# Patient Record
Sex: Female | Born: 1995 | Race: White | Hispanic: No | Smoking: Current every day smoker
Health system: Southern US, Community
[De-identification: ages and names within clinical notes are randomized; demographics above are authoritative.]

## PROBLEM LIST (undated history)

## (undated) DIAGNOSIS — T8859XA Other complications of anesthesia, initial encounter: Secondary | ICD-10-CM

## (undated) DIAGNOSIS — Z9889 Other specified postprocedural states: Secondary | ICD-10-CM

## (undated) DIAGNOSIS — T4145XA Adverse effect of unspecified anesthetic, initial encounter: Secondary | ICD-10-CM

## (undated) DIAGNOSIS — G43909 Migraine, unspecified, not intractable, without status migrainosus: Secondary | ICD-10-CM

## (undated) DIAGNOSIS — R112 Nausea with vomiting, unspecified: Secondary | ICD-10-CM

## (undated) DIAGNOSIS — H539 Unspecified visual disturbance: Secondary | ICD-10-CM

## (undated) HISTORY — PX: SKIN GRAFT: SHX250

## (undated) HISTORY — DX: Migraine, unspecified, not intractable, without status migrainosus: G43.909

---
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
## 2016-02-01 ENCOUNTER — Encounter: Payer: Self-pay | Admitting: Family Medicine

## 2016-02-01 ENCOUNTER — Ambulatory Visit (INDEPENDENT_AMBULATORY_CARE_PROVIDER_SITE_OTHER): Payer: Medicaid Other | Admitting: Family Medicine

## 2016-02-01 ENCOUNTER — Other Ambulatory Visit: Payer: Medicaid Other

## 2016-02-01 VITALS — BP 105/69 | HR 89 | Temp 99.2°F | Ht 65.0 in | Wt 140.6 lb

## 2016-02-01 DIAGNOSIS — Z309 Encounter for contraceptive management, unspecified: Secondary | ICD-10-CM

## 2016-02-01 DIAGNOSIS — R21 Rash and other nonspecific skin eruption: Secondary | ICD-10-CM

## 2016-02-01 DIAGNOSIS — R8290 Unspecified abnormal findings in urine: Secondary | ICD-10-CM | POA: Diagnosis not present

## 2016-02-01 DIAGNOSIS — Z3009 Encounter for other general counseling and advice on contraception: Secondary | ICD-10-CM

## 2016-02-01 LAB — URINALYSIS, COMPLETE
Bilirubin, UA: NEGATIVE
GLUCOSE, UA: NEGATIVE
Ketones, UA: NEGATIVE
Leukocytes, UA: NEGATIVE
NITRITE UA: NEGATIVE
PH UA: 5.5 (ref 5.0–7.5)
Protein, UA: NEGATIVE
RBC, UA: NEGATIVE
Specific Gravity, UA: 1.01 (ref 1.005–1.030)
UUROB: 0.2 mg/dL (ref 0.2–1.0)

## 2016-02-01 LAB — MICROSCOPIC EXAMINATION: RBC, UA: NONE SEEN /hpf (ref 0–?)

## 2016-02-01 LAB — PREGNANCY, URINE: PREG TEST UR: NEGATIVE

## 2016-02-01 NOTE — Progress Notes (Signed)
BP 105/69   Pulse 89   Temp 99.2 F (37.3 C) (Oral)   Ht '5\' 5"'$  (1.651 m)   Wt 140 lb 9.6 oz (63.8 kg)   BMI 23.40 kg/m    Subjective:    Patient ID: Elizabeth Clayton, female    DOB: Jun 22, 1995, 20 y.o.   MRN: [id number]  HPI: Elizabeth Clayton is a 20 y.o. female presenting on 02/01/2016 for Labwork needed (for [facility], Dr. Marcelino Scot, needs UA, CRP & ESR) and Discuss Nexplanon removal (has appointment on 9/15 for removal, would like to have new one put back in)   HPI Rash Patient has had recurrently over her life this rash that occurs which is a pinkish discoloration of her skin and she can write on it and I will leave white marks for a few minutes before it changes back to pink. It looks as though she splashed but it occurs over her whole body. She does not have it currently today. She says it is not pruritic or painful and she has never noted what brings it on. She denies any joint issues or breathing issues or renal issues that she knows of. She comes with recommendations from her orthopedic to have her tested for possible rheumatic disease or infections and to do a CRP and sedimentation rate and urinalysis.  Birth control counseling Patient is coming in today for birth control counseling as she wants to have her Nexplanon reinserted and the old one removed. She has an appointment to come in next week. Her last menstrual period started August 12 and ended August 29. She was due for her 3 year mark at the beginning of August and is almost a month overdue for removal and reinsertion. She has been using condoms in the meantime since she has let it lapse. She denies any dysuria or vaginal bleeding or vaginal discharge currently. She likes the Nexplanon would like to go back on it.  Relevant past medical, surgical, family and social history reviewed and updated as indicated. Interim medical history since our last visit reviewed. Allergies and medications reviewed and  updated.  Review of Systems  Constitutional: Negative for chills and fever.  HENT: Negative for congestion, ear discharge and ear pain.   Eyes: Negative for redness and visual disturbance.  Respiratory: Negative for chest tightness and shortness of breath.   Cardiovascular: Negative for chest pain and leg swelling.  Genitourinary: Negative for difficulty urinating, dysuria, flank pain, frequency, pelvic pain, vaginal bleeding, vaginal discharge and vaginal pain.  Musculoskeletal: Negative for arthralgias, back pain, gait problem, joint swelling and myalgias.  Skin: Positive for color change and rash.  Neurological: Negative for light-headedness and headaches.  Psychiatric/Behavioral: Negative for agitation and behavioral problems.  All other systems reviewed and are negative.   Per HPI unless specifically indicated above     Medication List       Accurate as of 02/01/16 12:53 PM. Always use your most recent med list.          NEXPLANON 68 MG Impl implant Generic drug:  etonogestrel Inject 1 each into the skin once. Implanted October 2014          Objective:    BP 105/69   Pulse 89   Temp 99.2 F (37.3 C) (Oral)   Ht '5\' 5"'$  (1.651 m)   Wt 140 lb 9.6 oz (63.8 kg)   BMI 23.40 kg/m   Wt Readings from Last 3 Encounters:  02/01/16 140 lb 9.6 oz (63.8 kg) (  71 %, Z= 0.54)*  07/07/15 126 lb 3.2 oz (57.2 kg) (50 %, Z= 0.00)*  04/13/15 133 lb 9.6 oz (60.6 kg) (64 %, Z= 0.36)*   * Growth percentiles are based on CDC 2-20 Years data.    Physical Exam  Constitutional: She is oriented to person, place, and time. She appears well-developed and well-nourished. No distress.  Eyes: Conjunctivae and EOM are normal. Pupils are equal, round, and reactive to light.  Neck: Neck supple. No thyromegaly present.  Cardiovascular: Normal rate, regular rhythm, normal heart sounds and intact distal pulses.   No murmur heard. Pulmonary/Chest: Effort normal and breath sounds normal. No  respiratory distress. She has no wheezes.  Abdominal: Soft. Bowel sounds are normal.  Musculoskeletal: Normal range of motion. She exhibits no edema or tenderness.  Lymphadenopathy:    She has no cervical adenopathy.  Neurological: She is alert and oriented to person, place, and time. Coordination normal.  Skin: Skin is warm and dry. No rash noted. She is not diaphoretic. No erythema. No pallor.  Psychiatric: She has a normal mood and affect. Her behavior is normal.  Nursing note and vitals reviewed.    Urinalysis: 6-10 daily BCs, greater than 10 epithelial cells, 0-10 renal epithelial cells, few bacteria  Urine pregnancy: Negative    Assessment & Plan:   Problem List Items Addressed This Visit    None    Visit Diagnoses    Rash and nonspecific skin eruption    -  Primary   Possible concern for dermatomyositis versus other rheumatological disorder, we'll do labs   Relevant Orders   Urinalysis, Complete   Sedimentation rate   ANA Comprehensive Panel   High sensitivity CRP   Birth control counseling       Patient is one month overdue for reinsertion of  Nexplanon will have come back in 1 week but will do urinary pregnancy test   Relevant Orders   Pregnancy, urine       Follow up plan: Return in about 1 week (around 02/08/2016), or if symptoms worsen or fail to improve, for Patient are to has appointment for Nexplanon .  Counseling provided for all of the vaccine components Orders Placed This Encounter  Procedures  . Urinalysis, Complete  . Pregnancy, urine  . Sedimentation rate  . ANA Comprehensive Panel  . High sensitivity CRP    Caryl Pina, MD [facility] 02/01/2016, 12:53 PM

## 2016-02-02 LAB — ANA COMPREHENSIVE PANEL
Chromatin Ab SerPl-aCnc: <0.2 AI (ref 0.0–0.9)
ENA RNP AB: 0.2 AI (ref 0.0–0.9)
ENA SM Ab Ser-aCnc: <0.2 AI (ref 0.0–0.9)
ENA SSA (RO) Ab: <0.2 AI (ref 0.0–0.9)
Scleroderma SCL-70: <0.2 AI (ref 0.0–0.9)
dsDNA Ab: 1 IU/mL (ref 0–9)

## 2016-02-02 LAB — SEDIMENTATION RATE: SED RATE: 2 mm/h (ref 0–32)

## 2016-02-02 LAB — HIGH SENSITIVITY CRP: CRP HIGH SENSITIVITY: 0.96 mg/L (ref 0.00–3.00)

## 2016-02-03 ENCOUNTER — Other Ambulatory Visit: Payer: Self-pay | Admitting: Family Medicine

## 2016-02-03 LAB — URINE CULTURE

## 2016-02-03 MED ORDER — SULFAMETHOXAZOLE-TRIMETHOPRIM 800-160 MG PO TABS: 1.0000 | ORAL_TABLET | Freq: Two times a day (BID) | ORAL | 0 refills | Status: DC

## 2016-02-10 ENCOUNTER — Encounter: Payer: Self-pay | Admitting: Family Medicine

## 2016-02-10 ENCOUNTER — Ambulatory Visit (INDEPENDENT_AMBULATORY_CARE_PROVIDER_SITE_OTHER): Payer: Medicaid Other | Admitting: Family Medicine

## 2016-02-10 VITALS — BP 115/74 | HR 85 | Temp 98.7°F | Ht 65.0 in | Wt 141.2 lb

## 2016-02-10 DIAGNOSIS — Z30017 Encounter for initial prescription of implantable subdermal contraceptive: Secondary | ICD-10-CM

## 2016-02-10 DIAGNOSIS — Z3046 Encounter for surveillance of implantable subdermal contraceptive: Secondary | ICD-10-CM | POA: Diagnosis not present

## 2016-02-10 LAB — PREGNANCY, URINE: PREG TEST UR: NEGATIVE

## 2016-02-10 NOTE — Progress Notes (Signed)
Nexplanon removal and reinsertion  Patient is one month overdue on her previous device and we did a urine pregnancy test and it is negative today.  Nexplanon removal and reinsertion: Patient educated on Nexplanon birth control and its side effects and the side effects from the procedure. Patient wishes to continue. Left inner arm prepped with Betadine. 3 mL of 2% lidocaine without epinephrine used for anesthesia. A 15 blade was used to make incision and forceps used to remove the previous device. Device was prepped using sterile procedure. Nexplanon was reinserted using package and Company directions. Steri-Strips were applied. Bleeding was minimal and patient tolerated procedure well.  Problem List Items Addressed This Visit    None    Visit Diagnoses    Nexplanon insertion    -  Primary   Relevant Orders   Pregnancy, urine   Nexplanon removal

## 2016-02-27 IMAGING — CT CT PELVIS W/O CM
3 of 4 series · 17 of 46 positions shown, 19 images · non-contrast
Comparison: 07/01/2013

CLINICAL DATA: Sacral and coccygeal pain for 3 months. History of
prior trauma and surgical fixation of the pubic symphysis and SI
joints.

EXAM:
CT PELVIS WITHOUT CONTRAST
TECHNIQUE: Multidetector CT imaging of the pelvis was performed following the
standard protocol without intravenous contrast.

[Series 2: soft tissue pelvis without (person_name) · axial · non-contrast · 0.69mm/px · z∈[-251,-36]mm · 10 of 53 slices shown, 12 images]
[im 5/53  soft-tissue]
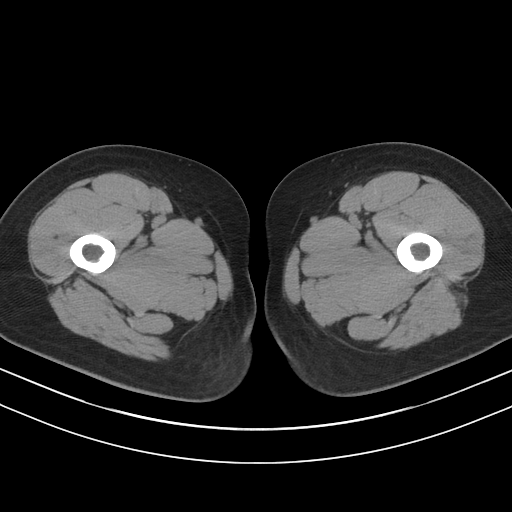
[im 5/53  bone]
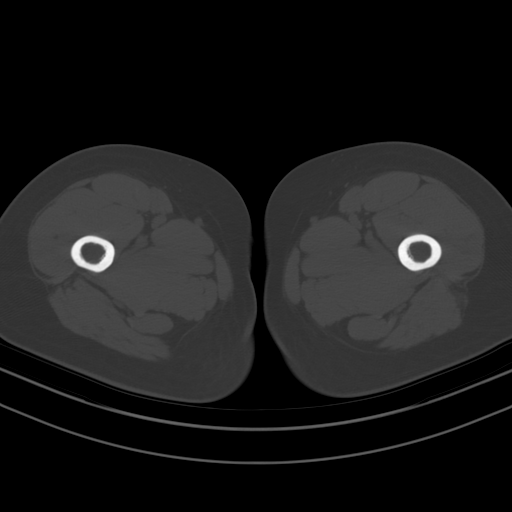
[im 10/53  soft-tissue]
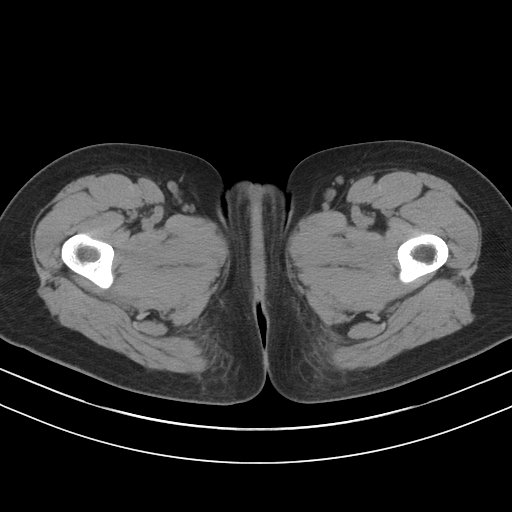
[im 15/53  soft-tissue]
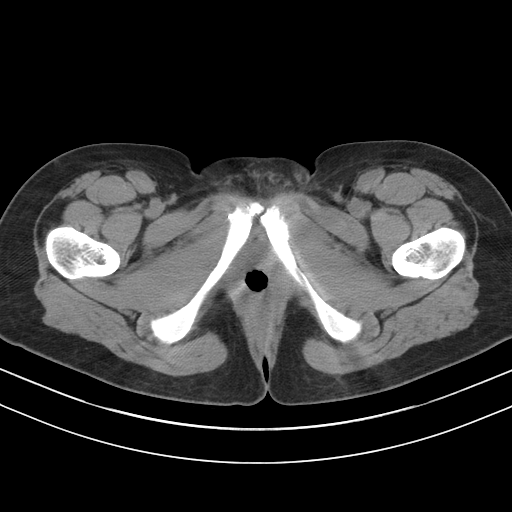
[im 19/53  soft-tissue]
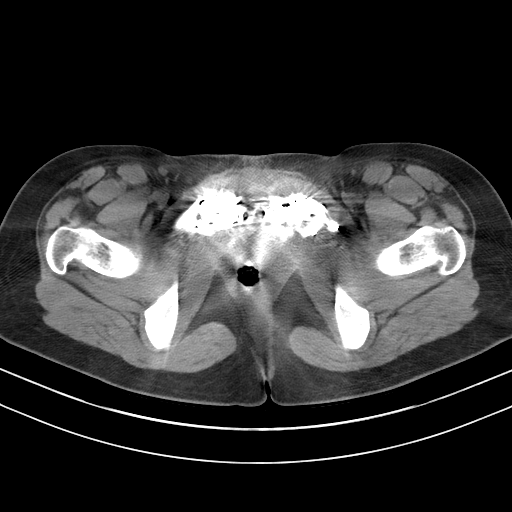
[im 24/53  soft-tissue]
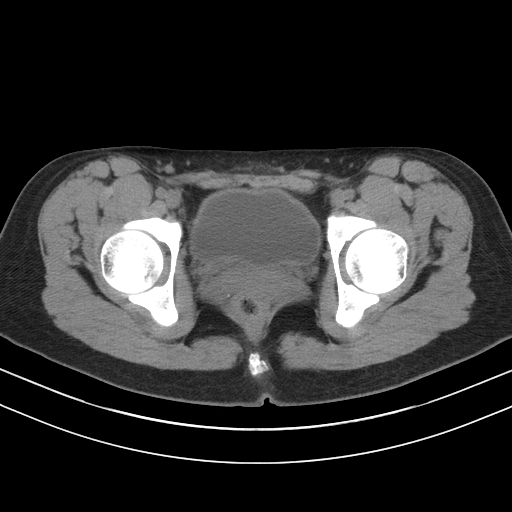
[im 29/53  soft-tissue]
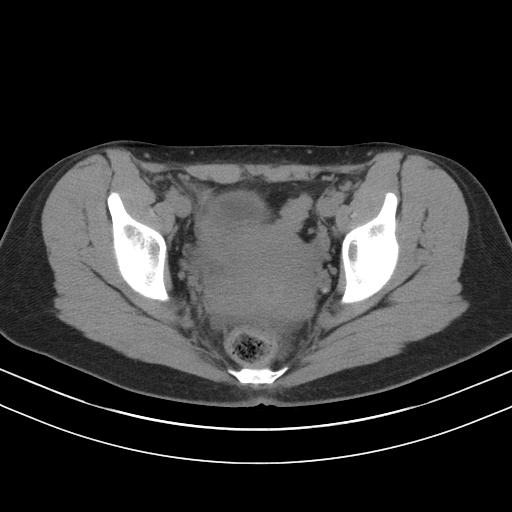
[im 34/53  soft-tissue]
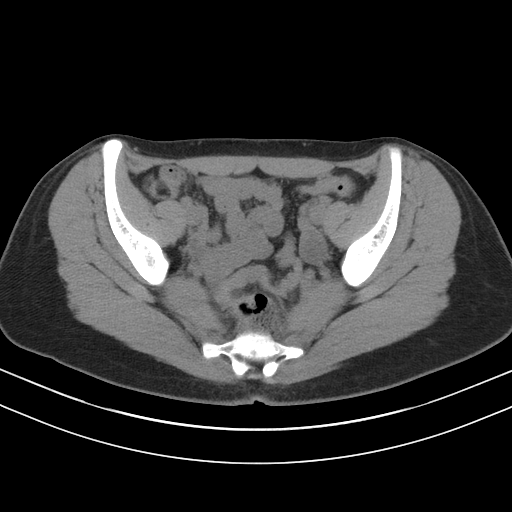
[im 38/53  soft-tissue]
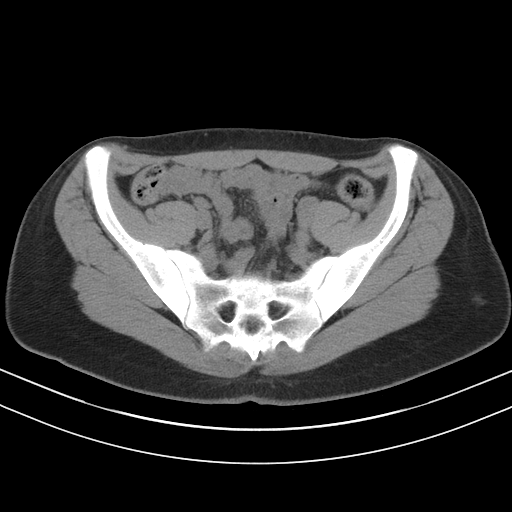
[im 43/53  soft-tissue]
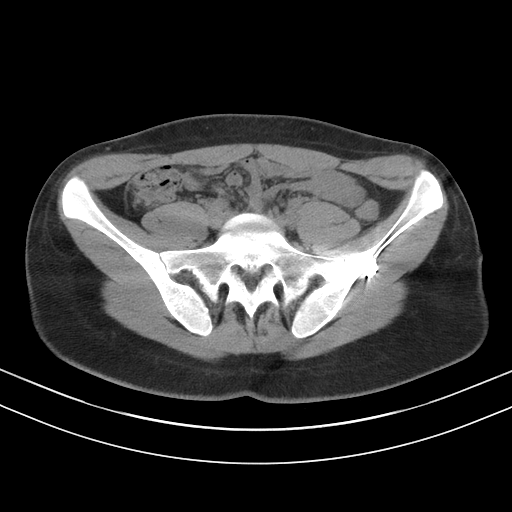
[im 43/53  bone]
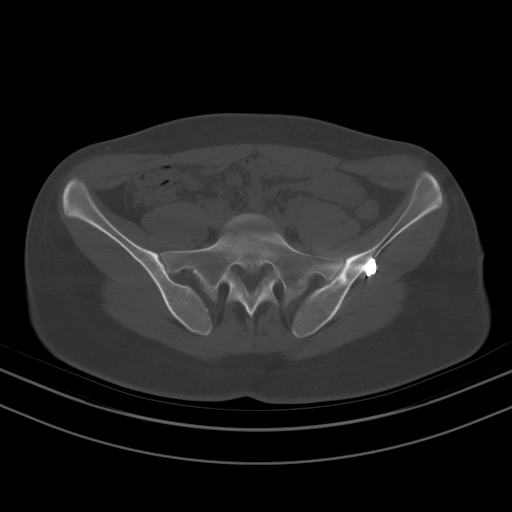
[im 48/53  soft-tissue]
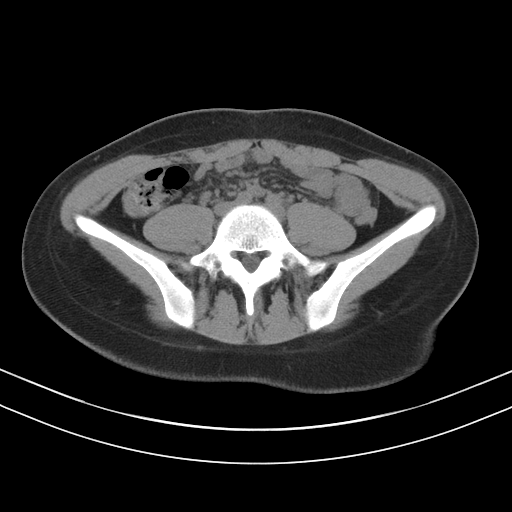

[Series 3: bone · axial · 0.69mm/px · z∈[-245,-164]mm · 4 of 88 slices shown]
[im 10/88  bone]
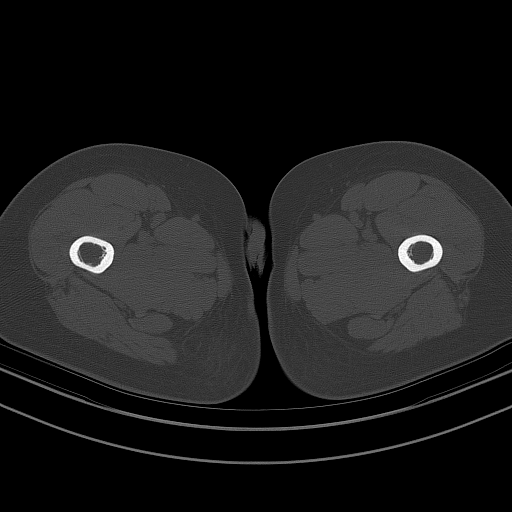
[im 19/88  bone]
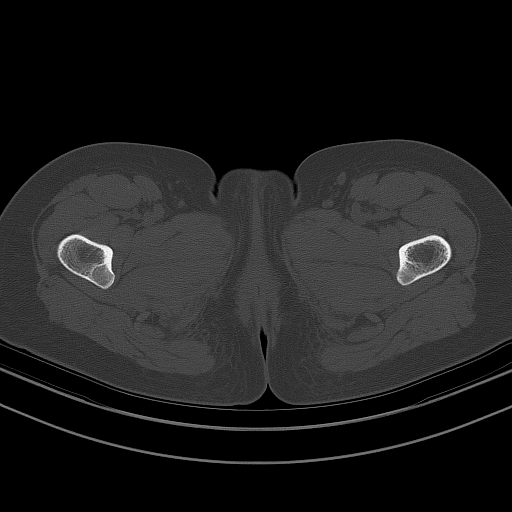
[im 28/88  bone]
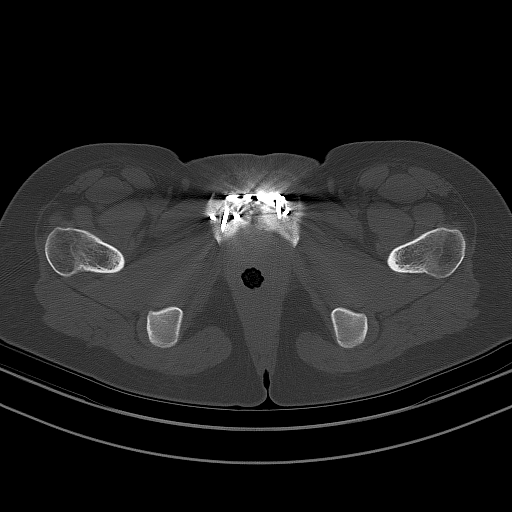
[im 37/88  bone]
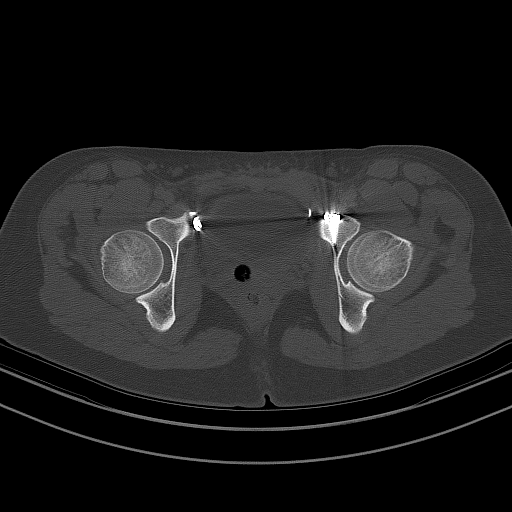

[Series 6: cor · coronal · 0.61mm/px · 3 of 61 slices shown]
[im 21/61  soft-tissue]
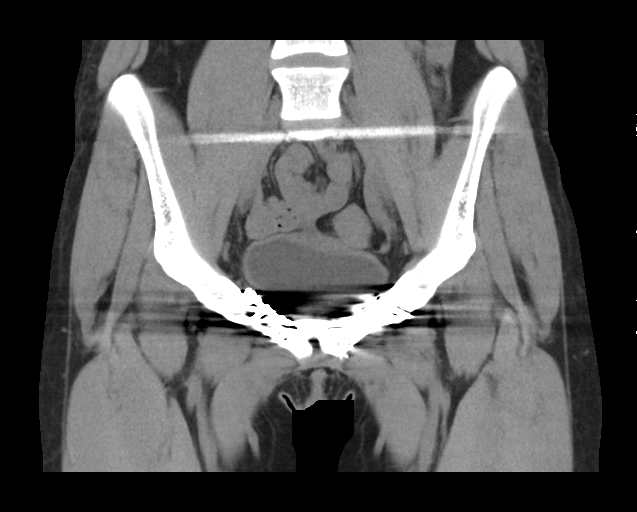
[im 27/61  soft-tissue]
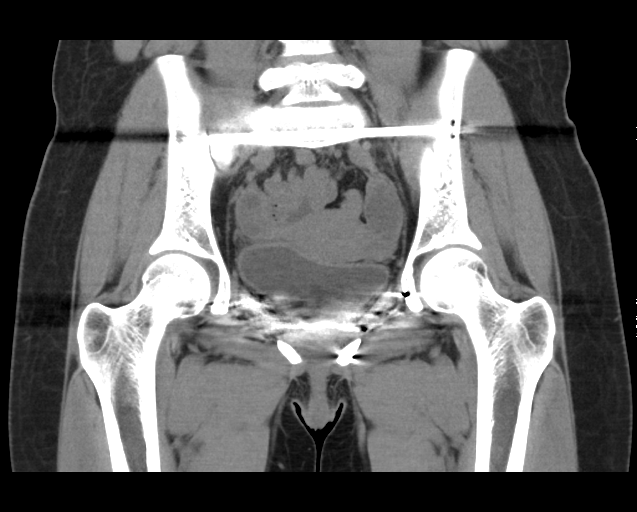
[im 34/61  soft-tissue]
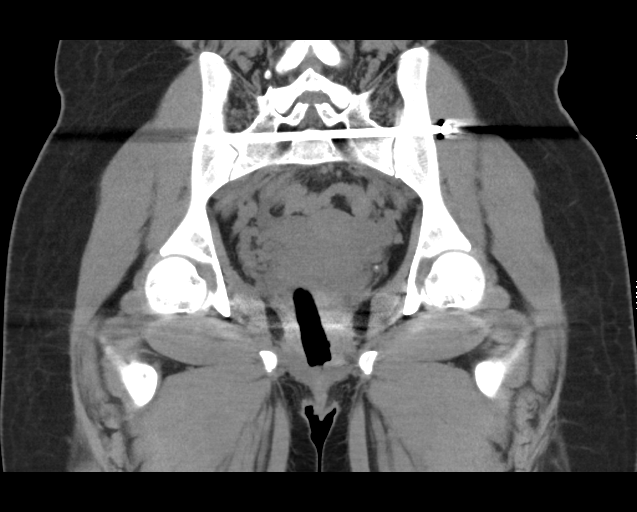

[17 of 46 positions shown; findings below may reference images not displayed]

FINDINGS: Stable plate and screws transfixing the pubic symphysis. Significant
associated artifact. No complicating features are demonstrated.
There is a lung screw crossing the left SI joint. It does not breach
the right SI joint. The SI joints are normal.

No significant intrapelvic abnormalities are identified. The uterus
and ovaries are normal. There is a small amount of free pelvic
fluid. The bladder is normal.
IMPRESSION: Intact fixation hardware without complicating features

No acute bony findings.

## 2016-04-27 ENCOUNTER — Telehealth: Payer: Self-pay | Admitting: Nurse Practitioner

## 2016-05-01 NOTE — Telephone Encounter (Signed)
[facility] [phone]

## 2016-05-15 ENCOUNTER — Ambulatory Visit (INDEPENDENT_AMBULATORY_CARE_PROVIDER_SITE_OTHER): Payer: Self-pay | Admitting: Family

## 2016-05-15 ENCOUNTER — Encounter: Payer: Self-pay | Admitting: Family

## 2016-05-15 ENCOUNTER — Encounter (INDEPENDENT_AMBULATORY_CARE_PROVIDER_SITE_OTHER): Payer: Self-pay

## 2016-05-15 VITALS — BP 110/79 | HR 90 | Temp 99.0°F | Ht 65.0 in | Wt 136.0 lb

## 2016-05-15 DIAGNOSIS — R1084 Generalized abdominal pain: Secondary | ICD-10-CM

## 2016-05-15 DIAGNOSIS — K279 Peptic ulcer, site unspecified, unspecified as acute or chronic, without hemorrhage or perforation: Secondary | ICD-10-CM

## 2016-05-15 MED ORDER — OMEPRAZOLE 20 MG PO CPDR: 20.0000 mg | DELAYED_RELEASE_CAPSULE | Freq: Every day | ORAL | 3 refills | Status: DC

## 2016-05-15 MED ORDER — PROMETHAZINE HCL 12.5 MG PO TABS: 12.5000 mg | ORAL_TABLET | Freq: Three times a day (TID) | ORAL | 0 refills | Status: DC | PRN

## 2016-05-15 NOTE — Progress Notes (Signed)
   Subjective:    Patient ID: Elizabeth Clayton, female    DOB: 07/09/1995, 20 y.o.   MRN: [id number]  Abdominal Pain  The current episode started more than 1 year ago. The onset quality is gradual. The problem occurs constantly. The problem has been gradually worsening. The pain is located in the generalized abdominal region. The pain is mild. Associated symptoms include belching, nausea and vomiting. Pertinent negatives include no constipation, diarrhea, fever, frequency or hematuria. The pain is aggravated by eating. Relieved by: not eating. She has tried antacids and H2 blockers for the symptoms. The treatment provided no relief.  Cough  Pertinent negatives include no fever.      Review of Systems  Constitutional: Negative for fever.  Respiratory: Positive for cough.   Gastrointestinal: Positive for abdominal pain, nausea and vomiting. Negative for constipation and diarrhea.  Genitourinary: Negative for frequency and hematuria.  All other systems reviewed and are negative.      Objective:   Physical Exam  Constitutional: She is oriented to person, place, and time. She appears well-developed and well-nourished. No distress.  HENT:  Head: Normocephalic.  Eyes: Pupils are equal, round, and reactive to light.  Neck: Normal range of motion. Neck supple. No thyromegaly present.  Cardiovascular: Normal rate, regular rhythm, normal heart sounds and intact distal pulses.   No murmur heard. Pulmonary/Chest: Effort normal and breath sounds normal. No respiratory distress. She has no wheezes.  Abdominal: Soft. Bowel sounds are normal. She exhibits no distension. There is no tenderness.  Musculoskeletal: Normal range of motion. She exhibits no edema or tenderness.  Neurological: She is alert and oriented to person, place, and time.  Skin: Skin is warm and dry.  Psychiatric: She has a normal mood and affect. Her behavior is normal. Judgment and thought content normal.  Vitals  reviewed.     BP 110/79   Pulse 90   Temp 99 F (37.2 C) (Oral)   Ht _0  (1.651 m)   Wt 136 lb (61.7 kg)   BMI 22.63 kg/m      Assessment & Plan:  1. Generalized abdominal pain - CMP14+EGFR - H Pylori, IGM, IGG, IGA AB  2. PUD (peptic ulcer disease) - CMP14+EGFR - H Pylori, IGM, IGG, IGA AB - omeprazole (PRILOSEC) 20 MG capsule; Take 1 capsule (20 mg total) by mouth daily.  Dispense: 30 capsule; Refill: 3 - promethazine (PHENERGAN) 12.5 MG tablet; Take 1 tablet (12.5 mg total) by mouth every 8 (eight) hours as needed for nausea or vomiting.  Dispense: 20 tablet; Refill: 0  Diet discussed- Avoid spicy foods, alcohol and caffeine Smoking cessation discussed NO NSAIDS Labs pending RTO 1 month Evelina Dun, FNP

## 2016-05-15 NOTE — Patient Instructions (Signed)
Peptic Ulcer °A peptic ulcer is a sore in the lining of the esophagus (esophageal ulcer), the stomach (gastric ulcer), or the first part of the small intestine (duodenal ulcer). The ulcer causes gradual wearing away (erosion) into the deeper tissue. °What are the causes? °Normally, the lining of the stomach and the small intestine protects itself from the acid that digests food. The protective lining can be damaged by: °· An infection caused by a germ (bacterium) called Helicobacter pylori or H. pylori. °· Regular use of NSAIDs, such as ibuprofen or aspirin. °· Rare tumors in the stomach, small intestine, or pancreas (Zollinger-Ellison syndrome). °What increases the risk? °The following factors may make you more likely to develop this condition: °· Smoking. °· Having a family history of ulcer disease. °What are the signs or symptoms? °Symptoms of this condition include: °· Burning pain or gnawing in the area between the chest and the belly button. The pain may be worse on an empty stomach and at night. °· Heartburn. °· Nausea and vomiting. °· Bloating. °If the ulcer results in bleeding, it can cause: °· Black, tarry stools. °· Vomiting of bright red blood. °· Vomiting of material that looks like coffee grounds. °How is this diagnosed? °This condition may be diagnosed based on: °· Medical history and physical exam. °· Various tests or procedures, such as: °¨ Blood tests, stool tests, or breath tests to check for the H. pylori bacterium. °¨ An X-ray exam (upper gastrointestinal series) of the esophagus, stomach, and small intestine. °¨ Upper endoscopy. The health care provider examines the esophagus, stomach, and small intestine using a small flexible tube that has a video camera at the end. °¨ Biopsy. A tissue sample is removed to be examined under a microscope. °How is this treated? °Treatment for this condition may include: °· Eliminating the cause of the ulcer, such as smoking or the use of NSAIDs or  alcohol. °· Medicines to reduce the amount of acid in your digestive tract. °· Antibiotic medicines, if the ulcer is caused by the H. pylori bacterium. °· An upper endoscopy to treat a bleeding ulcer. °· Surgery, if the bleeding is severe or if the ulcer created a hole somewhere in the digestive system. °Follow these instructions at home: °· Avoid alcohol and caffeine. °· Do not use any tobacco products, such as cigarettes, chewing tobacco, and e-cigarettes. If you need help quitting, ask your health care provider. °· Take over-the-counter and prescription medicines only as told by your health care provider. Do not use over-the-counter medicines in place of prescription medicines unless your health care provider approves. °· Keep all follow-up visits as told by your health care provider. This is important. °Contact a health care provider if: °· Your symptoms do not improve within 7 days of starting treatment. °· You have ongoing indigestion or heartburn. °Get help right away if: °· You have sudden, sharp, or persistent pain in your abdomen. °· You have bloody or dark black, tarry stools. °· You vomit blood or material that looks like coffee grounds. °· You become light-headed or you feel faint. °· You become weak. °· You become sweaty or clammy. °This information is not intended to replace advice given to you by your health care provider. Make sure you discuss any questions you have with your health care provider. °Document Released: 05/11/2000 Document Revised: 10/17/2015 Document Reviewed: 02/12/2015 °Elsevier Interactive Patient Education © 2017 [employer] ° °

## 2016-05-16 LAB — CMP14+EGFR
A/G RATIO: 1.9 (ref 1.2–2.2)
ALBUMIN: 4.7 g/dL (ref 3.5–5.5)
ALT: 14 IU/L (ref 0–32)
AST: 16 IU/L (ref 0–40)
Alkaline Phosphatase: 67 IU/L (ref 39–117)
BUN / CREAT RATIO: 16 (ref 9–23)
BUN: 10 mg/dL (ref 6–20)
Bilirubin Total: 0.4 mg/dL (ref 0.0–1.2)
CALCIUM: 9.6 mg/dL (ref 8.7–10.2)
CO2: 21 mmol/L (ref 18–29)
Chloride: 103 mmol/L (ref 96–106)
Creatinine, Ser: 0.63 mg/dL (ref 0.57–1.00)
GFR, EST AFRICAN AMERICAN: 150 mL/min/{1.73_m2} (ref >59)
GFR, EST NON AFRICAN AMERICAN: 130 mL/min/{1.73_m2} (ref >59)
GLOBULIN, TOTAL: 2.5 g/dL (ref 1.5–4.5)
Glucose: 84 mg/dL (ref 65–99)
POTASSIUM: 4.5 mmol/L (ref 3.5–5.2)
SODIUM: 141 mmol/L (ref 134–144)
Total Protein: 7.2 g/dL (ref 6.0–8.5)

## 2016-05-16 LAB — H PYLORI, IGM, IGG, IGA AB
H Pylori IgG: <0.9 U/mL (ref 0.0–0.8)
H pylori, IgM Abs: <9 units (ref 0.0–8.9)
H. PYLORI, IGA ABS: 24.6 U — AB (ref 0.0–8.9)

## 2016-05-17 ENCOUNTER — Other Ambulatory Visit: Payer: Self-pay | Admitting: Family

## 2016-05-17 MED ORDER — AMOXICILLIN 500 MG PO CAPS: 1000.0000 mg | ORAL_CAPSULE | Freq: Two times a day (BID) | ORAL | 0 refills | Status: DC

## 2016-05-17 MED ORDER — CLARITHROMYCIN 500 MG PO TABS: 500.0000 mg | ORAL_TABLET | Freq: Two times a day (BID) | ORAL | 0 refills | Status: DC

## 2016-05-30 ENCOUNTER — Telehealth: Payer: Self-pay | Admitting: Nurse Practitioner

## 2016-05-30 NOTE — Telephone Encounter (Signed)
Aware to try nasonex or flonase,  mucinex, delsym cough medication, allergy pills or which one fits her symptoms.

## 2016-05-31 ENCOUNTER — Other Ambulatory Visit: Payer: Self-pay | Admitting: Family

## 2016-05-31 DIAGNOSIS — L255 Unspecified contact dermatitis due to plants, except food: Secondary | ICD-10-CM

## 2016-08-16 ENCOUNTER — Other Ambulatory Visit: Payer: Self-pay | Admitting: Family

## 2016-08-16 DIAGNOSIS — K279 Peptic ulcer, site unspecified, unspecified as acute or chronic, without hemorrhage or perforation: Secondary | ICD-10-CM

## 2016-08-16 MED ORDER — PROMETHAZINE HCL 12.5 MG PO TABS: 12.5000 mg | ORAL_TABLET | Freq: Three times a day (TID) | ORAL | 0 refills | Status: DC | PRN

## 2017-02-07 ENCOUNTER — Ambulatory Visit (INDEPENDENT_AMBULATORY_CARE_PROVIDER_SITE_OTHER): Payer: Self-pay | Admitting: Family Medicine

## 2017-02-07 ENCOUNTER — Encounter: Payer: Self-pay | Admitting: Family Medicine

## 2017-02-07 VITALS — BP 111/77 | HR 68 | Temp 96.9°F | Ht 65.0 in | Wt 122.0 lb

## 2017-02-07 DIAGNOSIS — R1110 Vomiting, unspecified: Secondary | ICD-10-CM

## 2017-02-07 DIAGNOSIS — G43809 Other migraine, not intractable, without status migrainosus: Secondary | ICD-10-CM

## 2017-02-07 MED ORDER — PREDNISONE 20 MG PO TABS: ORAL_TABLET | ORAL | 0 refills | Status: DC

## 2017-02-07 MED ORDER — PROMETHAZINE HCL 25 MG PO TABS: 25.0000 mg | ORAL_TABLET | Freq: Three times a day (TID) | ORAL | 0 refills | Status: DC | PRN

## 2017-02-07 MED ORDER — NAPROXEN 375 MG PO TABS: 375.0000 mg | ORAL_TABLET | Freq: Two times a day (BID) | ORAL | 0 refills | Status: DC

## 2017-02-07 NOTE — Progress Notes (Signed)
   HPI  Patient presents today here for acute illness  Patient reports off-and-on episodes of emesis and nausea without good explanation for the last year. Her mothers concern that she has lost 10 pounds in last 2 weeks.  She complains of 2 weeks of emesis and frequent headaches. Patient has been tolerating fluids during this time and has tolerated ginger ale today.  She states over last 2 weeks she's also lost her appetite, felt weak, and intermittent mild dizziness. She describes her headache as bilateral frontal dull achy and persistent, at most occasions these last hours.  She has been treated for H. pylori previously, we reviewed her treatment she completed it as planned.  PMH: Smoking status noted ROS: Per HPI  Objective: BP 111/77   Pulse 68   Temp (!) 96.9 F (36.1 C) (Oral)   Ht 5\' 5"  (1.651 m)   Wt 122 lb (55.3 kg)   BMI 20.30 kg/m  Gen: NAD, alert, cooperative with exam HEENT: NCAT, EOMI, PERRL CV: RRR, good S1/S2, no murmur Resp: CTABL, no wheezes, non-labored Abd: SNTND, BS present, no guarding or organomegaly Ext: No edema, warm Neuro: Alert and oriented, strength 5/5 and sensation intact in bilateral upper and lower extremities  MSK:  No tenderness with stress applied to BL ASIS  Assessment and plan:  # Migraine headache Patient with high-grade headaches with likely GI symptoms, conversely she may have a viral process inducing migraine headaches. Treating headache with oral headache cocktail including prednisone, Phenergan, and naproxen. Given history of H. pylori discussed that naproxen persistently is no necessary, I did recommend taking OTC PPI during this time. Low threshold for return with multiple symptoms  Recurrent vomiting With weight loss- GI referral, no clear etiology. Abd exam is reassuring.   Pt is pursuing insurance coverage.    Orders Placed This Encounter  Procedures  . Ambulatory referral to Gastroenterology    Referral Priority:    Routine    Referral Type:   Consultation    Referral Reason:   Specialty Services Required    Number of Visits Requested:   1    Meds ordered this encounter  Medications  . predniSONE (DELTASONE) 20 MG tablet    Sig: 2 tabs daily for 3 days then 1 tab daily for 4 more days    Dispense:  10 tablet    Refill:  0  . promethazine (PHENERGAN) 25 MG tablet    Sig: Take 1 tablet (25 mg total) by mouth every 8 (eight) hours as needed for nausea or vomiting.    Dispense:  20 tablet    Refill:  0  . naproxen (NAPROSYN) 375 MG tablet    Sig: Take 1 tablet (375 mg total) by mouth 2 (two) times daily with a meal.    Dispense:  14 tablet    Refill:  0    Murtis SinkSam Bradshaw, MD [facility] 02/07/2017, 12:26 PM

## 2017-02-07 NOTE — Patient Instructions (Signed)
Great to see you!  Start prednisone and naproxen with food today, Finish all of the prednisone, you may use the naproxen as needed but I would not recommend using it long term  Use an over the counter prilosec or nexium 1 pill once daily while on naproxen and/or prednisone.   I have referred you to GI.

## 2017-03-18 ENCOUNTER — Ambulatory Visit: Payer: Self-pay | Admitting: Family Medicine

## 2017-03-19 ENCOUNTER — Encounter: Payer: Self-pay | Admitting: Nurse Practitioner

## 2017-04-11 ENCOUNTER — Encounter: Payer: Self-pay | Admitting: Family Medicine

## 2017-07-19 ENCOUNTER — Telehealth: Payer: Self-pay | Admitting: Nurse Practitioner

## 2017-07-19 NOTE — Telephone Encounter (Signed)
Was dismissed for no shows- what does she need?

## 2017-07-20 ENCOUNTER — Other Ambulatory Visit: Payer: Self-pay | Admitting: Nurse Practitioner

## 2018-05-08 ENCOUNTER — Ambulatory Visit: Payer: Medicaid Other | Admitting: Family

## 2018-05-08 ENCOUNTER — Ambulatory Visit: Payer: Self-pay | Admitting: Family Medicine

## 2018-05-08 ENCOUNTER — Encounter: Payer: Self-pay | Admitting: Family

## 2018-05-08 VITALS — BP 115/74 | HR 71 | Temp 97.9°F | Ht 65.0 in | Wt 148.0 lb

## 2018-05-08 DIAGNOSIS — Z975 Presence of (intrauterine) contraceptive device: Secondary | ICD-10-CM

## 2018-05-08 DIAGNOSIS — N921 Excessive and frequent menstruation with irregular cycle: Secondary | ICD-10-CM

## 2018-05-08 DIAGNOSIS — N926 Irregular menstruation, unspecified: Secondary | ICD-10-CM

## 2018-05-08 NOTE — Progress Notes (Signed)
Subjective:    Patient ID: Elizabeth Clayton, female    DOB: December 05, 1995, 22 y.o.   MRN: [id number]  Chief Complaint  Patient presents with  . Referral    HPI PT presents to the office today with abnormal bleeding. She currently has a Nexplanon that was placed two years ago. Before that she one placed for 3 years.   She states this abnormal bleeding started in October. She states in the five years she has had the Nexplanon she has not had any bleeding or menses.   She reports at the end of October she had a week and half of heavy bleeding. She states she was having to change a heavy tampon every hour and half. Then reports in November she had the same bleeding for one week then off one week and then another week of heavy bleeding.   She reports mood swings, migraines, and cramping.   Denies any changes in her exercise program, diet, and lifestyle. She is unsure what has caused these changes. Denies any hx of endometriosis or ovarian cysts.   She is sexually active. She reports having approx 10 partners, but with the same partners the last two years. Denies any new vaginal discharge or odor.   Review of Systems  Genitourinary: Positive for menstrual problem and vaginal bleeding.  All other systems reviewed and are negative.      Objective:   Physical Exam Vitals signs reviewed.  Constitutional:      General: She is not in acute distress.    Appearance: She is well-developed.  HENT:     Head: Normocephalic and atraumatic.     Right Ear: External ear normal.  Eyes:     Pupils: Pupils are equal, round, and reactive to light.  Neck:     Musculoskeletal: Normal range of motion and neck supple.     Thyroid: No thyromegaly.  Cardiovascular:     Rate and Rhythm: Normal rate and regular rhythm.     Heart sounds: Normal heart sounds. No murmur.  Pulmonary:     Effort: Pulmonary effort is normal. No respiratory distress.     Breath sounds: Normal breath sounds. No wheezing.    Abdominal:     General: Bowel sounds are normal. There is no distension.     Palpations: Abdomen is soft.     Tenderness: There is no abdominal tenderness.  Musculoskeletal: Normal range of motion.        General: No tenderness.  Skin:    General: Skin is warm and dry.  Neurological:     Mental Status: She is alert and oriented to person, place, and time.     Cranial Nerves: No cranial nerve deficit.     Deep Tendon Reflexes: Reflexes are normal and symmetric.  Psychiatric:        Behavior: Behavior normal.        Thought Content: Thought content normal.        Judgment: Judgment normal.       BP 115/74   Pulse 71   Temp 97.9 F (36.6 C) (Oral)   Ht 5\' 5"  (1.651 m)   Wt 148 lb (67.1 kg)   BMI 24.63 kg/m      Assessment & Plan:  Elizabeth LawrenceBrooklyn Montero comes in today with chief complaint of Referral   Diagnosis and orders addressed:  1. Nexplanon in place - Ambulatory referral to Gynecology  2. Abnormal menses - Ambulatory referral to Gynecology  3. Menorrhagia with irregular cycle - Ambulatory  referral to Gynecology  We will do referral to GYN Safe sex RTO if symptoms worsen or do not improve  Jannifer Rodney, FNP

## 2018-05-08 NOTE — Patient Instructions (Signed)

## 2018-05-19 DIAGNOSIS — Z113 Encounter for screening for infections with a predominantly sexual mode of transmission: Secondary | ICD-10-CM | POA: Diagnosis not present

## 2018-05-19 DIAGNOSIS — Z3009 Encounter for other general counseling and advice on contraception: Secondary | ICD-10-CM | POA: Diagnosis not present

## 2018-06-16 DIAGNOSIS — Z3049 Encounter for surveillance of other contraceptives: Secondary | ICD-10-CM | POA: Diagnosis not present

## 2018-09-29 DIAGNOSIS — O99331 Smoking (tobacco) complicating pregnancy, first trimester: Secondary | ICD-10-CM | POA: Diagnosis not present

## 2018-09-29 DIAGNOSIS — Z3401 Encounter for supervision of normal first pregnancy, first trimester: Secondary | ICD-10-CM | POA: Diagnosis not present

## 2018-10-02 DIAGNOSIS — O99321 Drug use complicating pregnancy, first trimester: Secondary | ICD-10-CM | POA: Diagnosis not present

## 2018-10-02 DIAGNOSIS — Z3401 Encounter for supervision of normal first pregnancy, first trimester: Secondary | ICD-10-CM | POA: Diagnosis not present

## 2018-10-21 DIAGNOSIS — Z01419 Encounter for gynecological examination (general) (routine) without abnormal findings: Secondary | ICD-10-CM | POA: Diagnosis not present

## 2018-10-21 DIAGNOSIS — Z113 Encounter for screening for infections with a predominantly sexual mode of transmission: Secondary | ICD-10-CM | POA: Diagnosis not present

## 2018-10-31 DIAGNOSIS — Z36 Encounter for antenatal screening for chromosomal anomalies: Secondary | ICD-10-CM | POA: Diagnosis not present

## 2018-10-31 DIAGNOSIS — Z3682 Encounter for antenatal screening for nuchal translucency: Secondary | ICD-10-CM | POA: Diagnosis not present

## 2018-11-19 DIAGNOSIS — Z3A14 14 weeks gestation of pregnancy: Secondary | ICD-10-CM | POA: Diagnosis not present

## 2018-11-19 DIAGNOSIS — M545 Low back pain: Secondary | ICD-10-CM | POA: Diagnosis not present

## 2018-12-12 DIAGNOSIS — Z3A18 18 weeks gestation of pregnancy: Secondary | ICD-10-CM | POA: Diagnosis not present

## 2018-12-12 DIAGNOSIS — O9989 Other specified diseases and conditions complicating pregnancy, childbirth and the puerperium: Secondary | ICD-10-CM | POA: Diagnosis not present

## 2018-12-12 DIAGNOSIS — M549 Dorsalgia, unspecified: Secondary | ICD-10-CM | POA: Diagnosis not present

## 2018-12-23 DIAGNOSIS — Z363 Encounter for antenatal screening for malformations: Secondary | ICD-10-CM | POA: Diagnosis not present

## 2019-02-18 DIAGNOSIS — Z130 Encounter for screening for diseases of the blood and blood-forming organs and certain disorders involving the immune mechanism: Secondary | ICD-10-CM | POA: Diagnosis not present

## 2019-02-18 DIAGNOSIS — Z3402 Encounter for supervision of normal first pregnancy, second trimester: Secondary | ICD-10-CM | POA: Diagnosis not present

## 2019-02-18 DIAGNOSIS — Z23 Encounter for immunization: Secondary | ICD-10-CM | POA: Diagnosis not present

## 2019-02-18 DIAGNOSIS — Z3A27 27 weeks gestation of pregnancy: Secondary | ICD-10-CM | POA: Diagnosis not present

## 2019-04-08 DIAGNOSIS — R102 Pelvic and perineal pain: Secondary | ICD-10-CM | POA: Diagnosis not present

## 2019-04-08 DIAGNOSIS — Z3A34 34 weeks gestation of pregnancy: Secondary | ICD-10-CM | POA: Diagnosis not present

## 2019-04-09 DIAGNOSIS — O4703 False labor before 37 completed weeks of gestation, third trimester: Secondary | ICD-10-CM | POA: Diagnosis not present

## 2019-04-09 DIAGNOSIS — Z3A34 34 weeks gestation of pregnancy: Secondary | ICD-10-CM | POA: Diagnosis not present

## 2019-04-09 DIAGNOSIS — O26853 Spotting complicating pregnancy, third trimester: Secondary | ICD-10-CM | POA: Diagnosis not present

## 2019-04-21 DIAGNOSIS — Z3483 Encounter for supervision of other normal pregnancy, third trimester: Secondary | ICD-10-CM | POA: Diagnosis not present

## 2019-04-21 DIAGNOSIS — O3663X1 Maternal care for excessive fetal growth, third trimester, fetus 1: Secondary | ICD-10-CM | POA: Diagnosis not present

## 2019-04-21 DIAGNOSIS — Z113 Encounter for screening for infections with a predominantly sexual mode of transmission: Secondary | ICD-10-CM | POA: Diagnosis not present

## 2019-04-21 DIAGNOSIS — Z3685 Encounter for antenatal screening for Streptococcus B: Secondary | ICD-10-CM | POA: Diagnosis not present

## 2019-05-05 DIAGNOSIS — O368131 Decreased fetal movements, third trimester, fetus 1: Secondary | ICD-10-CM | POA: Diagnosis not present

## 2019-06-17 DIAGNOSIS — Z130 Encounter for screening for diseases of the blood and blood-forming organs and certain disorders involving the immune mechanism: Secondary | ICD-10-CM | POA: Diagnosis not present

## 2019-06-17 DIAGNOSIS — Z3042 Encounter for surveillance of injectable contraceptive: Secondary | ICD-10-CM | POA: Diagnosis not present

## 2019-09-04 DIAGNOSIS — Z3042 Encounter for surveillance of injectable contraceptive: Secondary | ICD-10-CM | POA: Diagnosis not present

## 2019-10-12 DIAGNOSIS — H52223 Regular astigmatism, bilateral: Secondary | ICD-10-CM | POA: Diagnosis not present

## 2019-10-12 DIAGNOSIS — H5203 Hypermetropia, bilateral: Secondary | ICD-10-CM | POA: Diagnosis not present

## 2019-10-12 DIAGNOSIS — H5213 Myopia, bilateral: Secondary | ICD-10-CM | POA: Diagnosis not present

## 2019-10-28 DIAGNOSIS — H52223 Regular astigmatism, bilateral: Secondary | ICD-10-CM | POA: Diagnosis not present

## 2019-10-28 DIAGNOSIS — H5203 Hypermetropia, bilateral: Secondary | ICD-10-CM | POA: Diagnosis not present

## 2019-12-01 DIAGNOSIS — Z3042 Encounter for surveillance of injectable contraceptive: Secondary | ICD-10-CM | POA: Diagnosis not present

## 2020-02-17 ENCOUNTER — Telehealth: Payer: Self-pay | Admitting: Family Medicine

## 2020-02-17 NOTE — Telephone Encounter (Signed)
Pt wants and apt for Mirena IUD. She is aware that nurse will call her back for an apt to have a pregnancy test and apt to have mirena inserted. Please call back

## 2020-02-18 ENCOUNTER — Telehealth: Payer: Self-pay | Admitting: Family Medicine

## 2020-02-18 NOTE — Telephone Encounter (Signed)
This was an an open phone call on another encounter please refer to the original phone call.

## 2020-02-18 NOTE — Telephone Encounter (Signed)
Left message for pt to return call.

## 2020-02-18 NOTE — Telephone Encounter (Signed)
Have her schedule for a urine pregnancy 2 weeks before and try to schedule the insertion, the visit around her menstrual cycle, usually better mid cycle to near the end of the menstrual cycle, also send a message to Toniann Fail or Asher Muir to make sure that we have 1 in stock, if her cycles are not predictable then we do not have to follow a cycle but it will be less painful if she can get it near her cycle. Arville Care, MD [facility] 02/18/2020, 7:38 AM

## 2020-02-24 NOTE — Telephone Encounter (Signed)
No returned call will close encounter

## 2020-02-25 ENCOUNTER — Telehealth: Payer: Self-pay | Admitting: Family Medicine

## 2020-02-25 DIAGNOSIS — Z30431 Encounter for routine checking of intrauterine contraceptive device: Secondary | ICD-10-CM

## 2020-02-25 NOTE — Telephone Encounter (Signed)
Pt needs nurse to call her to set up appt to have pregnancy test done and then needs to be scheduled 2 wks after that to have mirena insertion.

## 2020-02-26 ENCOUNTER — Other Ambulatory Visit: Payer: Medicaid Other

## 2020-02-26 ENCOUNTER — Other Ambulatory Visit: Payer: Self-pay

## 2020-02-26 LAB — PREGNANCY, URINE: Preg Test, Ur: NEGATIVE

## 2020-02-26 NOTE — Telephone Encounter (Signed)
Left message for pt to return call.

## 2020-02-26 NOTE — Telephone Encounter (Signed)
Spoke with patient. She will come in today for urine preg. She has been scheduled for insertion 03/10/2020.  Pt had Nexplanon removed about 2 years ago.  Then was on depo but has not had injection in 3 months.  Pt is currently breast feeding. She knows to be abstinent for the next 2 weeks before insertion.Marland Kitchen

## 2020-03-10 ENCOUNTER — Ambulatory Visit (INDEPENDENT_AMBULATORY_CARE_PROVIDER_SITE_OTHER): Payer: Medicaid Other | Admitting: Family Medicine

## 2020-03-10 ENCOUNTER — Other Ambulatory Visit: Payer: Self-pay

## 2020-03-10 ENCOUNTER — Encounter: Payer: Self-pay | Admitting: Family Medicine

## 2020-03-10 VITALS — BP 118/77 | HR 85 | Temp 97.0°F | Ht 65.0 in | Wt 156.0 lb

## 2020-03-10 DIAGNOSIS — Z30430 Encounter for insertion of intrauterine contraceptive device: Secondary | ICD-10-CM

## 2020-03-10 LAB — PREGNANCY, URINE: Preg Test, Ur: NEGATIVE

## 2020-03-10 MED ORDER — LEVONORGESTREL 20 MCG/24HR IU IUD
1.0000 | INTRAUTERINE_SYSTEM | Freq: Once | INTRAUTERINE | Status: AC
Start: 2020-03-10 — End: ?

## 2020-03-10 NOTE — Progress Notes (Signed)
BP 118/77   Pulse 85   Temp (!) 97 F (36.1 C)   Ht 5\' 5"  (1.651 m)   Wt 156 lb (70.8 kg)   LMP 03/10/2020   SpO2 100%   BMI 25.96 kg/m    Subjective:   Patient ID: 03/12/2020, female    DOB: [birth year], 24 y.o.   MRN: [id number]  HPI: Elizabeth Clayton is a 24 y.o. female presenting on 03/10/2020 for IUD insertion   HPI Patient is coming in today for IUD insertion, she wants the Mirena.  Patient is 8 months postpartum and had been using the Depo-Provera, she wants to switch the IUD.  Relevant past medical, surgical, family and social history reviewed and updated as indicated. Interim medical history since our last visit reviewed. Allergies and medications reviewed and updated.  Review of Systems  Constitutional: Negative for chills and fever.  Genitourinary: Negative for menstrual problem, pelvic pain, vaginal bleeding, vaginal discharge and vaginal pain.  Musculoskeletal: Negative for back pain and gait problem.  Skin: Negative for rash.  Neurological: Negative for light-headedness and headaches.  Psychiatric/Behavioral: Negative for agitation and behavioral problems.  All other systems reviewed and are negative.   Per HPI unless specifically indicated above   Allergies as of 03/10/2020   No Known Allergies     Medication List       Accurate as of March 10, 2020  4:31 PM. If you have any questions, ask your nurse or doctor.        STOP taking these medications   Nexplanon 68 MG Impl implant Generic drug: etonogestrel Stopped by: March 12, 2020 Aldrick Derrig, MD        Objective:   BP 118/77   Pulse 85   Temp (!) 97 F (36.1 C)   Ht 5\' 5"  (1.651 m)   Wt 156 lb (70.8 kg)   LMP 03/10/2020   SpO2 100%   BMI 25.96 kg/m   Wt Readings from Last 3 Encounters:  03/10/20 156 lb (70.8 kg)  05/08/18 148 lb (67.1 kg)  02/07/17 122 lb (55.3 kg)    Physical Exam Vitals and nursing note reviewed. Exam conducted with a chaperone present.  Genitourinary:     Exam position: Lithotomy position.     Vagina: Normal.     Cervix: Normal.     Uterus: Normal.      Adnexa: Right adnexa normal and left adnexa normal.     Urine pregnancy negative  IUD insertion procedure: Patient was placed in stirrups and use a speculum. Patient was prepped with Betadine swabs using cotton balls. Tenaculum was used to grab the anterior cervix. Uterine sound was performed and found to be 7cm in length and anteroverted. Cervical dilation was not necessary. Mirena was placed using factory device at the correct depth that was measured and deployed without issue. The strings were cut leaving 1.5 cm extra. Patient tolerated procedure well and bleeding was minimal.   Assessment & Plan:   Problem List Items Addressed This Visit    None    Visit Diagnoses    Encounter for insertion of intrauterine contraceptive device (IUD)    -  Primary   Relevant Medications   levonorgestrel (MIRENA) 20 MCG/24HR IUD 1 each (Start on 03/10/2020  4:45 PM)   Other Relevant Orders   Pregnancy, urine       Follow up plan: Return in about 4 weeks (around 04/07/2020), or if symptoms worsen or fail to improve, for IUD recheck.  Counseling provided for  all of the vaccine components Orders Placed This Encounter  Procedures  . Pregnancy, urine    Arville Care, MD [facility] 03/10/2020, 4:31 PM

## 2020-04-15 ENCOUNTER — Encounter: Payer: Self-pay | Admitting: Family Medicine

## 2020-04-15 ENCOUNTER — Other Ambulatory Visit: Payer: Self-pay

## 2020-04-15 ENCOUNTER — Ambulatory Visit (INDEPENDENT_AMBULATORY_CARE_PROVIDER_SITE_OTHER): Payer: Medicaid Other | Admitting: Family Medicine

## 2020-04-15 VITALS — BP 117/76 | HR 77 | Temp 97.7°F | Ht 65.0 in | Wt 148.0 lb

## 2020-04-15 DIAGNOSIS — Z975 Presence of (intrauterine) contraceptive device: Secondary | ICD-10-CM

## 2020-04-15 NOTE — Progress Notes (Signed)
   BP 117/76   Pulse 77   Temp 97.7 F (36.5 C)   Ht 5\' 5"  (1.651 m)   Wt 148 lb (67.1 kg)   SpO2 97%   BMI 24.63 kg/m    Subjective:   Patient ID: , female    DOB: 10-04-1995, 24 y.o.   MRN: [id number]  HPI: Elizabeth Clayton is a 24 y.o. female presenting on 04/15/2020 for IUD re check (Mirena inserted about 4 weeks ago. Changed from Nexplanon)   HPI Patient is coming in for an IUD recheck.  She has had light bleeding since the placement but just recently has stopped.  She denies any other major issues with it.  She had the Mirena placed 4 weeks ago.  Relevant past medical, surgical, family and social history reviewed and updated as indicated. Interim medical history since our last visit reviewed. Allergies and medications reviewed and updated.  Review of Systems  Genitourinary: Positive for menstrual problem. Negative for vaginal bleeding, vaginal discharge and vaginal pain.    Per HPI unless specifically indicated above   Allergies as of 04/15/2020   No Known Allergies     Medication List    as of April 15, 2020  1:19 PM   You have not been prescribed any medications.      Objective:   BP 117/76   Pulse 77   Temp 97.7 F (36.5 C)   Ht 5\' 5"  (1.651 m)   Wt 148 lb (67.1 kg)   SpO2 97%   BMI 24.63 kg/m   Wt Readings from Last 3 Encounters:  04/15/20 148 lb (67.1 kg)  03/10/20 156 lb (70.8 kg)  05/08/18 148 lb (67.1 kg)    Physical Exam Exam conducted with a chaperone present.  Genitourinary:    Exam position: Lithotomy position.     Vagina: Normal.     Cervix: Normal.     Uterus: Normal.      Adnexa: Right adnexa normal.     Comments: Strings are visible in cervical opening and in place.      Assessment & Plan:   Problem List Items Addressed This Visit    None    Visit Diagnoses    IUD (intrauterine device) in place    -  Primary       Follow up plan: Return in about 1 year (around 04/15/2021), or if symptoms worsen or  fail to improve, for Physical and well woman exam.  Counseling provided for all of the vaccine components No orders of the defined types were placed in this encounter.   14/12/19, MD 04/17/2021 [facility] 04/15/2020, 1:19 PM

## 2020-10-21 ENCOUNTER — Ambulatory Visit: Payer: Medicaid Other | Admitting: Family Medicine

## 2020-11-15 ENCOUNTER — Ambulatory Visit: Payer: Medicaid Other | Admitting: Family Medicine

## 2020-11-16 ENCOUNTER — Encounter: Payer: Self-pay | Admitting: Family Medicine

## 2020-11-22 ENCOUNTER — Telehealth: Payer: Self-pay | Admitting: Family Medicine

## 2020-11-22 NOTE — Telephone Encounter (Signed)
Patient would like IUD removed. Can I put her in your walk in day 7/14?

## 2020-11-23 NOTE — Telephone Encounter (Signed)
Mirena   Placed earlier this year.   No problems  Trying to conceive.

## 2020-11-23 NOTE — Telephone Encounter (Signed)
Does she want to talk about a new form of birth control, is her IUD still within range or is she trying to get pregnant?  But yes they can put her in a day when needed if she wants it sooner.

## 2020-11-23 NOTE — Telephone Encounter (Signed)
Pt is scheduled for 12/13/20 at 11:25.

## 2020-12-13 ENCOUNTER — Ambulatory Visit: Payer: Medicaid Other | Admitting: Family Medicine

## 2020-12-15 DIAGNOSIS — Z3189 Encounter for other procreative management: Secondary | ICD-10-CM | POA: Diagnosis not present

## 2020-12-15 DIAGNOSIS — Z30432 Encounter for removal of intrauterine contraceptive device: Secondary | ICD-10-CM | POA: Diagnosis not present

## 2021-01-25 ENCOUNTER — Ambulatory Visit: Payer: Medicaid Other | Admitting: Family Medicine

## 2021-03-31 DIAGNOSIS — Z3201 Encounter for pregnancy test, result positive: Secondary | ICD-10-CM | POA: Diagnosis not present

## 2021-03-31 DIAGNOSIS — O200 Threatened abortion: Secondary | ICD-10-CM | POA: Diagnosis not present

## 2021-03-31 DIAGNOSIS — O208 Other hemorrhage in early pregnancy: Secondary | ICD-10-CM | POA: Diagnosis not present

## 2021-04-04 DIAGNOSIS — O200 Threatened abortion: Secondary | ICD-10-CM | POA: Diagnosis not present

## 2021-10-15 DIAGNOSIS — R112 Nausea with vomiting, unspecified: Secondary | ICD-10-CM | POA: Diagnosis not present

## 2021-10-16 ENCOUNTER — Encounter: Payer: Self-pay | Admitting: Nurse Practitioner

## 2021-10-16 ENCOUNTER — Ambulatory Visit: Payer: Medicaid Other | Admitting: Nurse Practitioner

## 2021-10-16 VITALS — BP 103/73 | HR 95 | Temp 98.9°F | Ht 64.0 in | Wt 125.2 lb

## 2021-10-16 DIAGNOSIS — N911 Secondary amenorrhea: Secondary | ICD-10-CM

## 2021-10-16 DIAGNOSIS — R112 Nausea with vomiting, unspecified: Secondary | ICD-10-CM | POA: Diagnosis not present

## 2021-10-16 LAB — PREGNANCY, URINE: Preg Test, Ur: POSITIVE — AB

## 2021-10-16 NOTE — Progress Notes (Signed)
Acute Office Visit  Subjective:     Patient ID: Elizabeth Clayton, female    DOB: Apr 18, 1996, 26 y.o.   MRN: [id number]  Chief Complaint  Patient presents with   Diarrhea    Abdominal Pain This is a recurrent problem. The current episode started in the past 7 days. The onset quality is gradual. The problem has been resolved. The pain is mild. Associated symptoms include nausea and vomiting. Pertinent negatives include no fever.  Emesis  This is a recurrent problem. The current episode started 1 to 4 weeks ago. The problem occurs 2 to 4 times per day. The problem has been resolved. The emesis has an appearance of stomach contents. There has been no fever. Associated symptoms include abdominal pain. Pertinent negatives include no chest pain, chills, fever or sweats. She has tried nothing (Promethazine) for the symptoms. The treatment provided significant relief.    Review of Systems  Constitutional:  Negative for chills and fever.  HENT: Negative.    Respiratory: Negative.    Cardiovascular: Negative.  Negative for chest pain.  Gastrointestinal:  Positive for abdominal pain, nausea and vomiting.  Skin: Negative.  Negative for rash.  All other systems reviewed and are negative.      Objective:    BP 103/73 (BP Location: Right Arm, Patient Position: Sitting, Cuff Size: Normal)   Pulse 95   Temp 98.9 F (37.2 C)   Ht 5\' 4"  (1.626 m)   Wt 125 lb 3.2 oz (56.8 kg)   LMP 09/22/2021 (Exact Date) Comment: ending  SpO2 97%   BMI 21.49 kg/m  BP Readings from Last 3 Encounters:  10/16/21 103/73  04/15/20 117/76  03/10/20 118/77   Wt Readings from Last 3 Encounters:  10/16/21 125 lb 3.2 oz (56.8 kg)  04/15/20 148 lb (67.1 kg)  03/10/20 156 lb (70.8 kg)      Physical Exam Vitals and nursing note reviewed.  Constitutional:      Appearance: Normal appearance.  HENT:     Head: Normocephalic.     Right Ear: External ear normal.     Left Ear: External ear normal.     Nose: Nose  normal.     Mouth/Throat:     Mouth: Mucous membranes are moist.     Pharynx: Oropharynx is clear.  Eyes:     Conjunctiva/sclera: Conjunctivae normal.  Cardiovascular:     Rate and Rhythm: Normal rate and regular rhythm.     Pulses: Normal pulses.     Heart sounds: Normal heart sounds.  Pulmonary:     Effort: Pulmonary effort is normal.     Breath sounds: Normal breath sounds.  Abdominal:     General: Bowel sounds are normal.     Tenderness: There is no abdominal tenderness. There is no right CVA tenderness or left CVA tenderness.  Skin:    Findings: No rash.  Neurological:     General: No focal deficit present.     Mental Status: She is alert and oriented to person, place, and time.  Psychiatric:        Behavior: Behavior normal.    No results found for any visits on 10/16/21.      Assessment & Plan:  Patient presents with nausea vomiting for the past week and has recently had nausea vomiting every single month in the past 3 months.  Patient reports a history of ulcers.  No bloody diarrhea or vomiting. Completed referral to GI  For secondary amenorrhea: Completed urine pregnancy test  results pending.  Follow-up with Assessment Problem List Items Addressed This Visit   None Visit Diagnoses     Nausea and vomiting, unspecified vomiting type    -  Primary   Relevant Orders   Ambulatory referral to Gastroenterology   Secondary amenorrhea       Relevant Orders   Pregnancy, urine       No orders of the defined types were placed in this encounter.   Return if symptoms worsen or fail to improve.  Daryll Drown, NP

## 2021-10-16 NOTE — Addendum Note (Signed)
Addended by: Daryll Drown on: 10/16/2021 12:15 PM   Modules accepted: Orders

## 2021-10-16 NOTE — Patient Instructions (Signed)
Secondary Amenorrhea  Secondary amenorrhea occurs when a female who was previously having menstrual periods has not had them for 3-6 months. A menstrual period is the monthly shedding of the lining of the uterus. The lining of the uterus is made up of blood, tissue, fluid, and mucus. The flow of blood usually occurs during 3-7 consecutive days each month. This condition has many causes. In many cases, treating the underlying cause will return menstrual periods back to a normal cycle. What are the causes? The most common cause of this condition is pregnancy. Other medical conditions that can cause secondary amenorrhea include: Cirrhosis of the liver. Conditions of the blood. Diabetes. Epilepsy. Chronic kidney disease. Polycystic ovary disease. A hormonal imbalance. Ovarian failure. Cystic fibrosis. Early menopause. Cushing syndrome. Thyroid problems. Other causes may include: Malnutrition. Stress or anxiety. Medicines. Extreme obesity. Low body weight or drastic weight loss. Removal of the ovaries or uterus. Contraceptive pills, patches, or vaginal rings. What increases the risk? You are more likely to develop this condition if: You have a family history of this condition. You have an eating disorder. You do extreme athletic training. You have a chronic disease. You abuse substances such as alcohol or cigarettes. What are the signs or symptoms? The main symptom of this condition is a lack of menstrual periods for 3-6 months in a female who previously had menstrual periods. How is this diagnosed? This condition may be diagnosed based on: Your medical history. A physical exam. A pelvic exam to check for problems with your reproductive organs. A procedure to examine the uterus. A measurement of your body mass index (BMI). You may also have other tests, including: Blood tests that measure certain hormones in your body and rule out pregnancy. Urine tests. Imaging tests, such as  an ultrasound, CT scan, or MRI. How is this treated? Treatment for this condition depends on the cause of the amenorrhea. It may involve: Correcting diet-related problems. Treating underlying conditions. Medicines. Lifestyle changes. Surgery. If the condition cannot be corrected, it is sometimes possible to start menstrual periods with medicines. Follow these instructions at home: Lifestyle     Maintain a healthy diet. In general, a healthy diet includes lots of fruits and vegetables, low-fat dairy products, lean meats, and foods that contain fiber. Ask to meet with a registered dietitian for nutrition counseling and meal planning. Maintain a healthy weight. Talk to your health care provider before trying any new diet or exercise plan. Exercise at least 30 minutes 5 or more days each week. Exercising includes brisk walking, yard work, biking, running, swimming, and team sports like basketball and soccer. Ask your health care provider which exercises are safe for you. Get enough sleep. Plan your sleep time to allow for 7-9 hours of sleep each night. Learn to manage stress. Explore relaxation techniques such as meditation, journaling, yoga, or tai chi. General instructions Be aware of changes in your menstrual cycle. Keep a record of when you have your menstrual period. Note the date your period starts, how long it lasts, and any problems you experience. Take over-the-counter and prescription medicines only as told by your health care provider. Keep all follow-up visits. This is important. Contact a health care provider if: Your periods do not return to normal after treatment. Summary Secondary amenorrhea is when a female who was previously having menstrual periods has not gotten her period for 3-6 months. This condition has many causes. In many cases, treating the underlying cause will return menstrual periods back to a  normal cycle. Talk to your health care provider if your periods do not  return to normal after treatment. This information is not intended to replace advice given to you by your health care provider. Make sure you discuss any questions you have with your health care provider. Document Revised: 12/30/2019 Document Reviewed: 12/30/2019 [employer] Patient Education  2023 [employer]  

## 2021-11-03 ENCOUNTER — Ambulatory Visit: Payer: Medicaid Other | Admitting: Family Medicine

## 2021-11-08 DIAGNOSIS — Z3481 Encounter for supervision of other normal pregnancy, first trimester: Secondary | ICD-10-CM | POA: Diagnosis not present

## 2021-11-08 DIAGNOSIS — O09291 Supervision of pregnancy with other poor reproductive or obstetric history, first trimester: Secondary | ICD-10-CM | POA: Diagnosis not present

## 2022-02-05 DIAGNOSIS — Z363 Encounter for antenatal screening for malformations: Secondary | ICD-10-CM | POA: Diagnosis not present

## 2022-04-02 DIAGNOSIS — Z3402 Encounter for supervision of normal first pregnancy, second trimester: Secondary | ICD-10-CM | POA: Diagnosis not present

## 2022-04-02 DIAGNOSIS — Z3483 Encounter for supervision of other normal pregnancy, third trimester: Secondary | ICD-10-CM | POA: Diagnosis not present

## 2022-04-02 DIAGNOSIS — Z23 Encounter for immunization: Secondary | ICD-10-CM | POA: Diagnosis not present

## 2022-05-29 DIAGNOSIS — Z113 Encounter for screening for infections with a predominantly sexual mode of transmission: Secondary | ICD-10-CM | POA: Diagnosis not present

## 2022-05-29 DIAGNOSIS — Z3685 Encounter for antenatal screening for Streptococcus B: Secondary | ICD-10-CM | POA: Diagnosis not present

## 2022-05-29 DIAGNOSIS — Z3483 Encounter for supervision of other normal pregnancy, third trimester: Secondary | ICD-10-CM | POA: Diagnosis not present

## 2022-06-11 DIAGNOSIS — O365930 Maternal care for other known or suspected poor fetal growth, third trimester, not applicable or unspecified: Secondary | ICD-10-CM | POA: Diagnosis not present

## 2022-06-22 DIAGNOSIS — Z9689 Presence of other specified functional implants: Secondary | ICD-10-CM | POA: Diagnosis not present

## 2022-06-22 DIAGNOSIS — F17210 Nicotine dependence, cigarettes, uncomplicated: Secondary | ICD-10-CM | POA: Diagnosis not present

## 2022-06-22 DIAGNOSIS — O99354 Diseases of the nervous system complicating childbirth: Secondary | ICD-10-CM | POA: Diagnosis not present

## 2022-06-22 DIAGNOSIS — R519 Headache, unspecified: Secondary | ICD-10-CM | POA: Diagnosis not present

## 2022-06-22 DIAGNOSIS — Z9889 Other specified postprocedural states: Secondary | ICD-10-CM | POA: Diagnosis not present

## 2022-06-22 DIAGNOSIS — Z3483 Encounter for supervision of other normal pregnancy, third trimester: Secondary | ICD-10-CM | POA: Diagnosis not present

## 2022-06-22 DIAGNOSIS — Z2839 Other underimmunization status: Secondary | ICD-10-CM | POA: Diagnosis not present

## 2022-06-22 DIAGNOSIS — Z3A39 39 weeks gestation of pregnancy: Secondary | ICD-10-CM | POA: Diagnosis not present

## 2022-06-22 DIAGNOSIS — Z8759 Personal history of other complications of pregnancy, childbirth and the puerperium: Secondary | ICD-10-CM | POA: Diagnosis not present

## 2022-06-22 DIAGNOSIS — O99334 Smoking (tobacco) complicating childbirth: Secondary | ICD-10-CM | POA: Diagnosis not present

## 2022-06-23 DIAGNOSIS — Z3A39 39 weeks gestation of pregnancy: Secondary | ICD-10-CM | POA: Diagnosis not present

## 2022-06-23 DIAGNOSIS — O09293 Supervision of pregnancy with other poor reproductive or obstetric history, third trimester: Secondary | ICD-10-CM | POA: Diagnosis not present

## 2022-07-27 DIAGNOSIS — Z7251 High risk heterosexual behavior: Secondary | ICD-10-CM | POA: Diagnosis not present

## 2022-07-27 DIAGNOSIS — Z3202 Encounter for pregnancy test, result negative: Secondary | ICD-10-CM | POA: Diagnosis not present

## 2022-07-27 DIAGNOSIS — Z30430 Encounter for insertion of intrauterine contraceptive device: Secondary | ICD-10-CM | POA: Diagnosis not present

## 2022-12-24 ENCOUNTER — Telehealth: Payer: Self-pay | Admitting: Family Medicine

## 2022-12-24 NOTE — Telephone Encounter (Signed)
Pt is breast feeding. She is not pregnant.  Rash all over. Head toe.  Ok for steroid injection. States that the injection is the only thing that works. 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 Creams do not help.

## 2022-12-24 NOTE — Telephone Encounter (Signed)
Patient could get a shot while breast-feeding of steroids, the only major recommendation for steroids when breast-feeding is to not breast-feed within 4 hours after the administration.  The only major issues with that as it can cause the infant to be more fussy and agitated and sometimes not sleep as well because the steroids can keep them awake.  Short-term doses to treat something that such as poison ivy have no long-term effects on the infant

## 2022-12-24 NOTE — Telephone Encounter (Signed)
Pt has been informed. States that she will see how it goes and call back if needed. She does not want the injection at this time.
